# Patient Record
Sex: Female | Born: 1955 | Race: Black or African American | Hispanic: No | Marital: Married | Smoking: Never smoker
Health system: Southern US, Community
[De-identification: ages and names within clinical notes are randomized; demographics above are authoritative.]

## PROBLEM LIST (undated history)

## (undated) DIAGNOSIS — C50919 Malignant neoplasm of unspecified site of unspecified female breast: Secondary | ICD-10-CM

## (undated) DIAGNOSIS — I341 Nonrheumatic mitral (valve) prolapse: Secondary | ICD-10-CM

## (undated) DIAGNOSIS — M21619 Bunion of unspecified foot: Secondary | ICD-10-CM

## (undated) DIAGNOSIS — G5600 Carpal tunnel syndrome, unspecified upper limb: Secondary | ICD-10-CM

## (undated) DIAGNOSIS — M8580 Other specified disorders of bone density and structure, unspecified site: Secondary | ICD-10-CM

## (undated) HISTORY — DX: Nonrheumatic mitral (valve) prolapse: I34.1

## (undated) HISTORY — DX: Other specified disorders of bone density and structure, unspecified site: M85.80

## (undated) HISTORY — DX: Carpal tunnel syndrome, unspecified upper limb: G56.00

## (undated) HISTORY — DX: Bunion of unspecified foot: M21.619

## (undated) HISTORY — DX: Malignant neoplasm of unspecified site of unspecified female breast: C50.919

---

## 1991-11-24 DIAGNOSIS — C50919 Malignant neoplasm of unspecified site of unspecified female breast: Secondary | ICD-10-CM

## 1991-11-24 HISTORY — PX: MASTECTOMY: SHX3

## 1991-11-24 HISTORY — DX: Malignant neoplasm of unspecified site of unspecified female breast: C50.919

## 1998-11-04 ENCOUNTER — Other Ambulatory Visit: Admission: RE | Admit: 1998-11-04 | Discharge: 1998-11-04 | Payer: Self-pay | Admitting: Obstetrics & Gynecology

## 2002-04-12 ENCOUNTER — Other Ambulatory Visit: Admission: RE | Admit: 2002-04-12 | Discharge: 2002-04-12 | Payer: Self-pay | Admitting: Obstetrics and Gynecology

## 2002-05-08 ENCOUNTER — Ambulatory Visit (HOSPITAL_BASED_OUTPATIENT_CLINIC_OR_DEPARTMENT_OTHER): Admission: RE | Admit: 2002-05-08 | Discharge: 2002-05-08 | Payer: Self-pay | Admitting: Surgery

## 2002-05-08 ENCOUNTER — Encounter (INDEPENDENT_AMBULATORY_CARE_PROVIDER_SITE_OTHER): Payer: Self-pay | Admitting: *Deleted

## 2003-06-29 ENCOUNTER — Other Ambulatory Visit: Admission: RE | Admit: 2003-06-29 | Discharge: 2003-06-29 | Payer: Self-pay | Admitting: Obstetrics and Gynecology

## 2004-06-30 ENCOUNTER — Other Ambulatory Visit: Admission: RE | Admit: 2004-06-30 | Discharge: 2004-06-30 | Payer: Self-pay | Admitting: Obstetrics and Gynecology

## 2005-07-08 ENCOUNTER — Other Ambulatory Visit: Admission: RE | Admit: 2005-07-08 | Discharge: 2005-07-08 | Payer: Self-pay | Admitting: Obstetrics and Gynecology

## 2005-12-16 ENCOUNTER — Ambulatory Visit: Payer: Self-pay | Admitting: Gastroenterology

## 2006-07-12 ENCOUNTER — Other Ambulatory Visit: Admission: RE | Admit: 2006-07-12 | Discharge: 2006-07-12 | Payer: Self-pay | Admitting: Obstetrics and Gynecology

## 2009-12-25 ENCOUNTER — Encounter: Admission: RE | Admit: 2009-12-25 | Discharge: 2009-12-25 | Payer: Self-pay | Admitting: Family Medicine

## 2010-01-22 ENCOUNTER — Encounter: Admission: RE | Admit: 2010-01-22 | Discharge: 2010-01-22 | Payer: Self-pay | Admitting: Family Medicine

## 2010-07-02 ENCOUNTER — Encounter: Admission: RE | Admit: 2010-07-02 | Discharge: 2010-07-02 | Payer: Self-pay | Admitting: Family Medicine

## 2010-12-14 ENCOUNTER — Encounter: Payer: Self-pay | Admitting: Family Medicine

## 2011-04-10 NOTE — Op Note (Signed)
[place]  Patient:    Sandra Rojas, Sandra Rojas Visit Number: [id number] MRN: [id number]          Service Type: DSU Location: [facility] Attending Physician:  Katha Cabal Dictated by:   Thorn[place] Park Daphine Deutscher, M.D. Proc. Date: 05/08/02 Admit Date:  05/08/2002   CC:         Quita Skye. Artis Flock, M.D.  Janine Limbo, M.D.  Alfredia Ferguson, M.D.   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 Following her chemotherapy and TRAM flap, she has gotten along very well. She was referred when a little small area was felt palpable in the lateral most aspect of the TRAM. This area was of concern to her and she wanted to go ahead and have this excised and again, it is a new palpable mass in the scar in the superior lateral aspect of the TRAM scar.  SURGEON:  Thornton Park. Daphine Deutscher, M.D.  ANESTHESIA:  MAC.  DESCRIPTION OF PROCEDURE:  The patient was taken to room 8 at [facility] on May 08, 2002, and after the area had been localized and sedation, the area was prepped in Betadine and draped sterilely. I infiltrated along the scar with 1% lidocaine. I cut an ellipse of tissue removing the old scar and then going down deep to remove the palpable mass. Bleeding was controlled with the electrocautery and with one little stitch of 4-0 Vicryl. A mass was brought out and opened and I cut into it and put a stitch to mark it, but it appeared to be more like scar tissue possibly from a focus of fat necrosis. The wound was closed with 4-0 and 5-0 Vicryl with Benzoin Steri-Strips on the skin. The patient seemed to tolerate the procedure well. She will be given Vicodin to take for pain and will be followed up in the office in approximately two weeks. Dictated by:    Thornton Park Daphine Deutscher, M.D. Attending Physician:  Katha Cabal DD:  05/08/02 TD:  05/09/02 Job: [id number]

## 2011-06-23 ENCOUNTER — Other Ambulatory Visit: Payer: Self-pay | Admitting: Family Medicine

## 2011-06-23 DIAGNOSIS — R911 Solitary pulmonary nodule: Secondary | ICD-10-CM

## 2011-07-13 ENCOUNTER — Ambulatory Visit
Admission: RE | Admit: 2011-07-13 | Discharge: 2011-07-13 | Disposition: A | Discharge status: Home / Self Care | Payer: No Typology Code available for payment source | Source: Ambulatory Visit | Attending: Family Medicine | Admitting: Family Medicine

## 2011-07-13 ENCOUNTER — Other Ambulatory Visit: Payer: Self-pay | Admitting: Family Medicine

## 2011-07-13 DIAGNOSIS — R911 Solitary pulmonary nodule: Secondary | ICD-10-CM

## 2011-08-25 ENCOUNTER — Institutional Professional Consult (permissible substitution): Payer: No Typology Code available for payment source | Admitting: Pulmonary Disease

## 2011-09-29 ENCOUNTER — Encounter: Payer: Self-pay | Admitting: Pulmonary Disease

## 2011-09-30 ENCOUNTER — Encounter: Payer: Self-pay | Admitting: Pulmonary Disease

## 2011-09-30 ENCOUNTER — Ambulatory Visit (INDEPENDENT_AMBULATORY_CARE_PROVIDER_SITE_OTHER): Payer: Managed Care, Other (non HMO) | Admitting: Pulmonary Disease

## 2011-09-30 VITALS — BP 114/76 | HR 69 | Temp 98.1°F | Ht 65.5 in | Wt 132.6 lb

## 2011-09-30 DIAGNOSIS — R918 Other nonspecific abnormal finding of lung field: Secondary | ICD-10-CM | POA: Insufficient documentation

## 2011-09-30 NOTE — Patient Instructions (Signed)
No further workup recommended at this time for your ct chest. Would be happy to see you again if you develop pulmonary symptoms or a suggestion of recurrent pulmonary infection.

## 2011-09-30 NOTE — Assessment & Plan Note (Signed)
The patient has apical scarring with areas of bronchiectasis that are stable from her CT of a year ago.  She also has tiny pulmonary nodules with an area of tree in bud that are also stable from last year.  Most likely, these are inflammatory in nature, and may be related to MAC.  The patient is totally asymptomatic from a pulmonary standpoint, and therefore I would not pursue any further.  I had a long discussion with her about scarring, bronchiectasis, as well as possible MAC colonization.  I have explained that she would not require further followup unless she begins to develop new clinical symptoms.

## 2011-09-30 NOTE — Progress Notes (Signed)
  Subjective:    Patient ID: Sandra Rojas, female    DOB: Jun 13, 1956, 55 y.o.   MRN: [id number]  HPI The patient is a 55 year old female who I've been asked to see for an abnormal CT chest.  This was first noted in 2011, and this showed scarring and bronchiectasis in the apices bilaterally, scattered tiny pulmonary nodules, as well as one area of tree in bud nodularity in the left lower lobe.  She has undergone a repeat CT scan this year, with no change in the above findings.  The patient denies all pulmonary symptoms, including cough, congestion, mucus, or dyspnea.  She is eating well, and her weight has been stable.  She has never lived in [place] or [place], and has no history of TB exposure.  She has a history of a negative PPD in the past.  She has never smoked, and her health maintenance is up to date.  She does have a history of breast cancer in 1993 where she underwent mastectomy and radiation.   Review of Systems  Constitutional: Negative for fever and unexpected weight change.  HENT: Positive for nosebleeds. Negative for ear pain, congestion, sore throat, rhinorrhea, sneezing, trouble swallowing, dental problem, postnasal drip and sinus pressure.   Eyes: Negative for redness and itching.  Respiratory: Negative for cough, shortness of breath and wheezing.   Cardiovascular: Positive for palpitations. Negative for leg swelling.  Gastrointestinal: Negative for nausea and vomiting.  Genitourinary: Negative for dysuria.  Musculoskeletal: Negative for gait problem.  Skin: Negative for rash.  Neurological: Negative for headaches.  Hematological: Does not bruise/bleed easily.  Psychiatric/Behavioral: Negative for dysphoric mood. The patient is not nervous/anxious.        Objective:   Physical Exam Constitutional:  Well developed, no acute distress  HENT:  Nares patent without discharge  Oropharynx without exudate, palate and uvula are normal  Eyes:  Perrla, eomi, no scleral  icterus  Neck:  No JVD, no TMG  Cardiovascular:  Normal rate, regular rhythm, no rubs or gallops. 2/6 sem        Intact distal pulses  Pulmonary :  Normal breath sounds, no stridor or respiratory distress   No rales, rhonchi, or wheezing  Abdominal:  Soft, nondistended, bowel sounds present.  No tenderness noted.   Musculoskeletal:  No lower extremity edema noted.  Lymph Nodes:  No cervical lymphadenopathy noted  Skin:  No cyanosis noted  Neurologic:  Alert, appropriate, moves all 4 extremities without obvious deficit.         Assessment & Plan:

## 2012-01-05 ENCOUNTER — Other Ambulatory Visit: Payer: Self-pay | Admitting: Family Medicine

## 2012-01-05 DIAGNOSIS — Z800 Family history of malignant neoplasm of digestive organs: Secondary | ICD-10-CM

## 2012-02-26 ENCOUNTER — Other Ambulatory Visit: Payer: Managed Care, Other (non HMO)

## 2012-03-06 IMAGING — CT CT CHEST W/O CM
3 of 4 series · 17 of 30 positions shown, 19 images · non-contrast
Comparison: CT chest of 07/02/2010 and 12/25/2009

CLINICAL DATA: Follow up of lung nodules, history of right breast
carcinoma in 3004 with mastectomy, chemotherapy, and radiation
therapy

CT CHEST WITHOUT CONTRAST
TECHNIQUE: Multidetector CT imaging of the chest was performed
following the standard protocol without IV contrast.

[Series 3: routine chest · axial · 0.70mm/px · z∈[-283,-53]mm · 5 of 70 slices shown, 7 images]
[im 12/70  mediastinal]
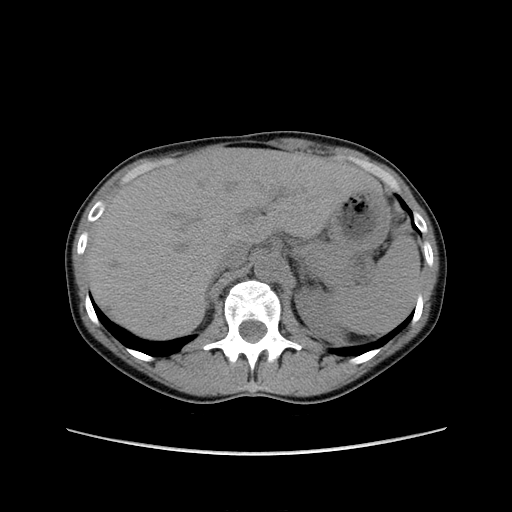
[im 12/70  lung]
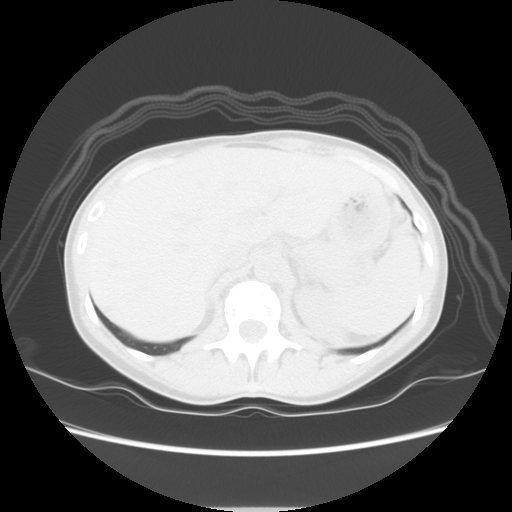
[im 24/70  lung]
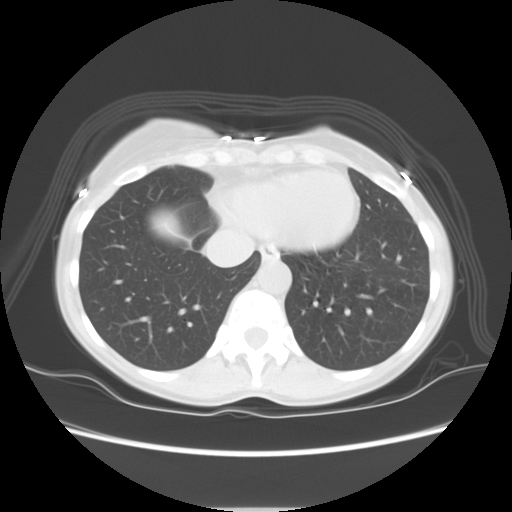
[im 35/70  lung]
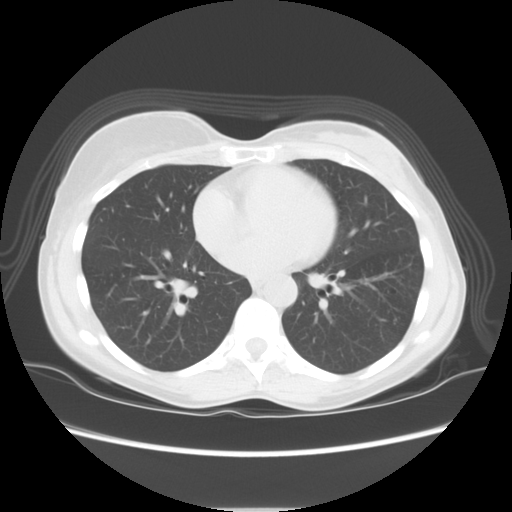
[im 47/70  lung]
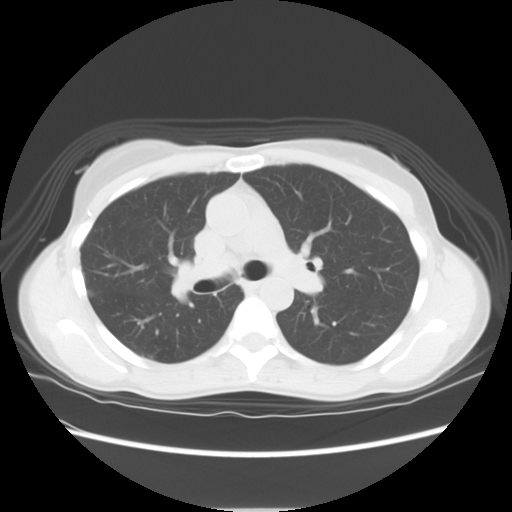
[im 58/70  mediastinal]
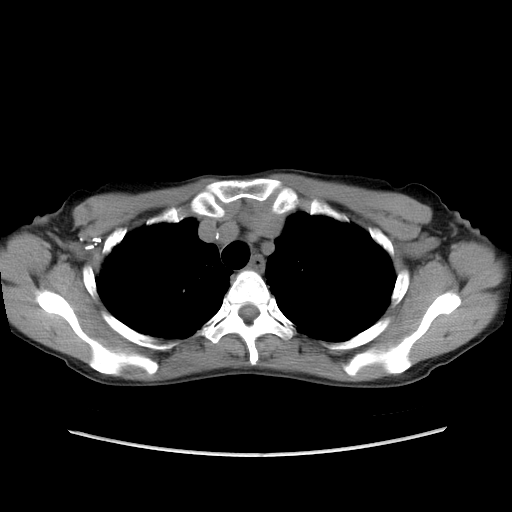
[im 58/70  lung]
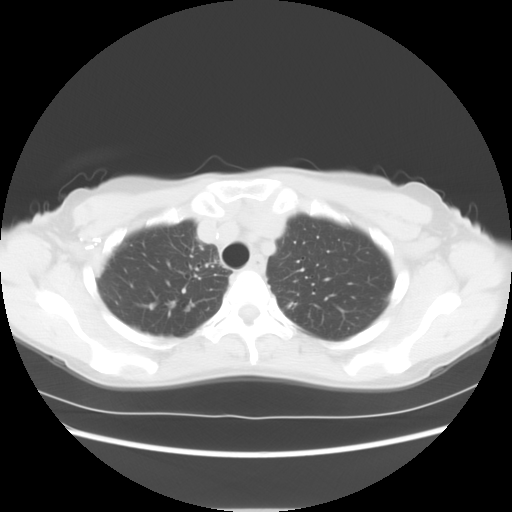

[Series 4: lung windows · axial · 0.70mm/px · z∈[-238,-58]mm · 4 of 62 slices shown]
[im 13/62  lung]
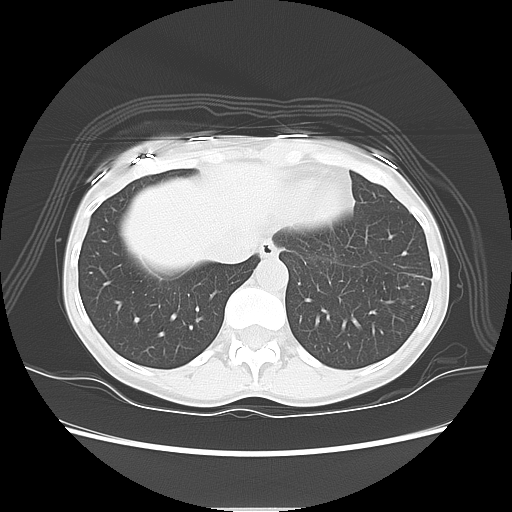
[im 25/62  lung]
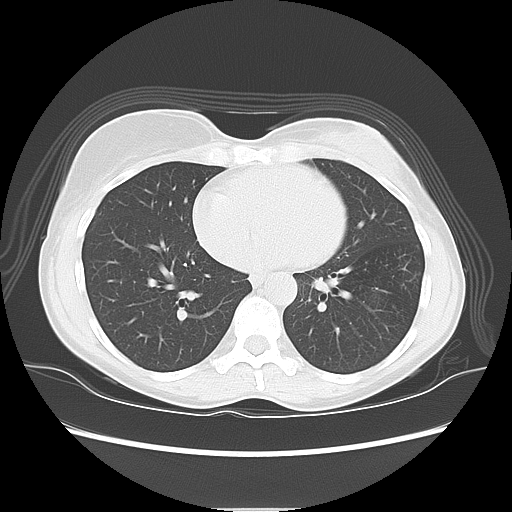
[im 37/62  lung]
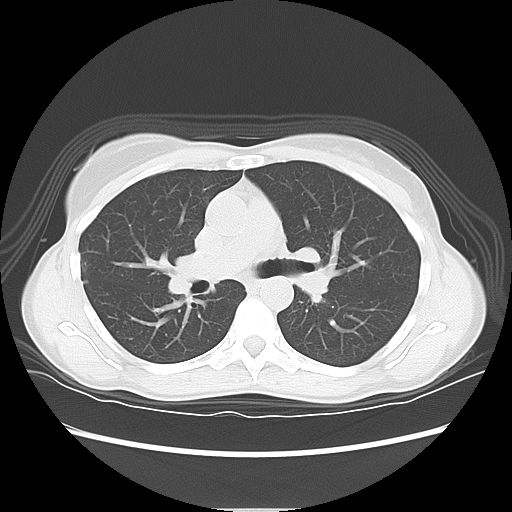
[im 49/62  lung]
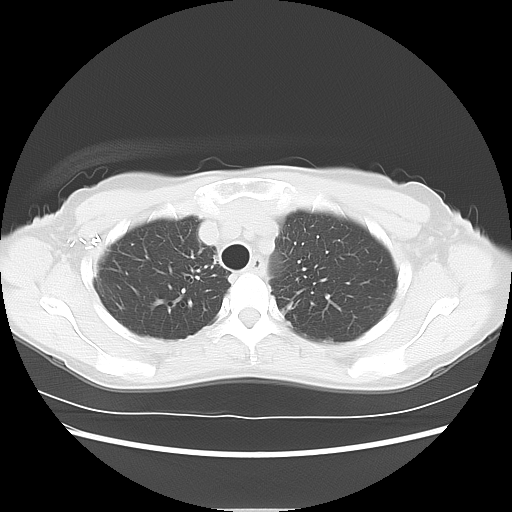

[Series 602: sagittal body · sagittal · 0.70mm/px · 8 of 145 slices shown]
[im 11/145  mediastinal]
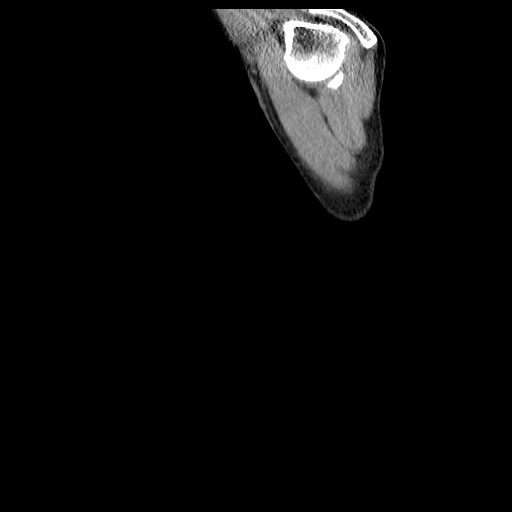
[im 31/145  mediastinal]
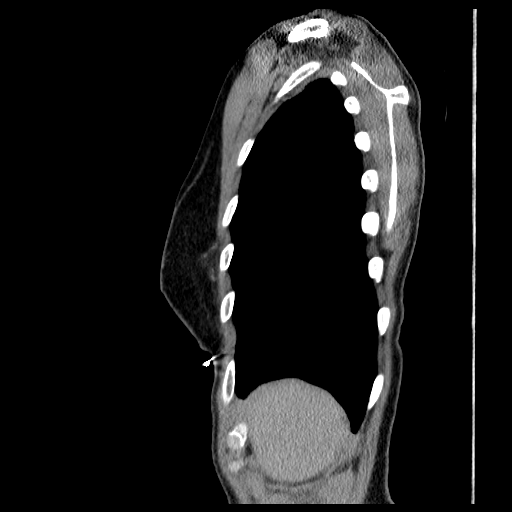
[im 52/145  mediastinal]
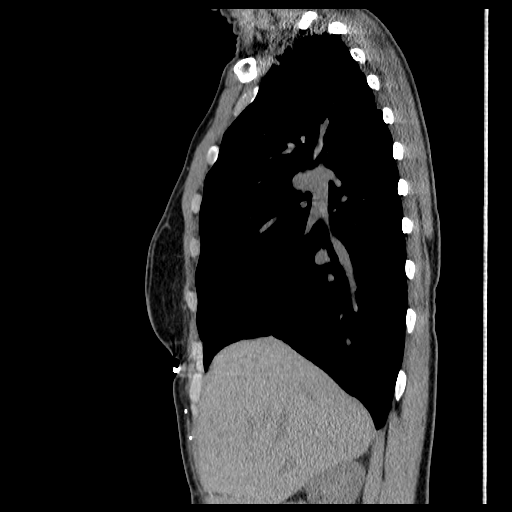
[im 62/145  mediastinal]
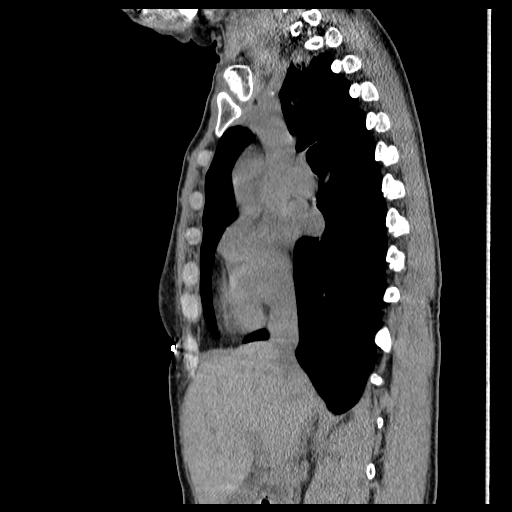
[im 83/145  mediastinal]
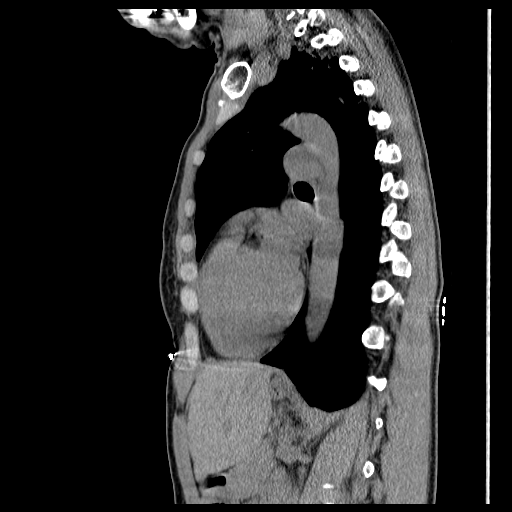
[im 93/145  mediastinal]
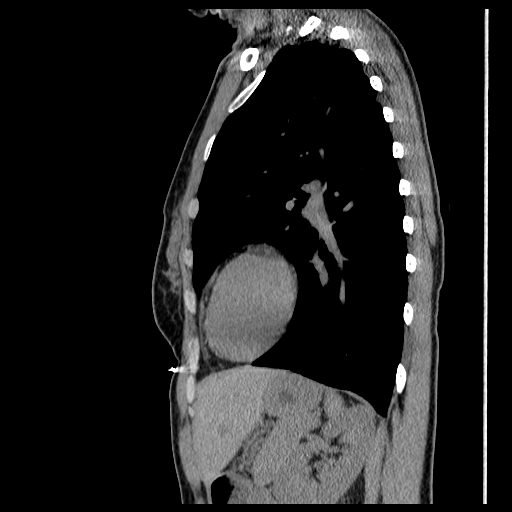
[im 114/145  mediastinal]
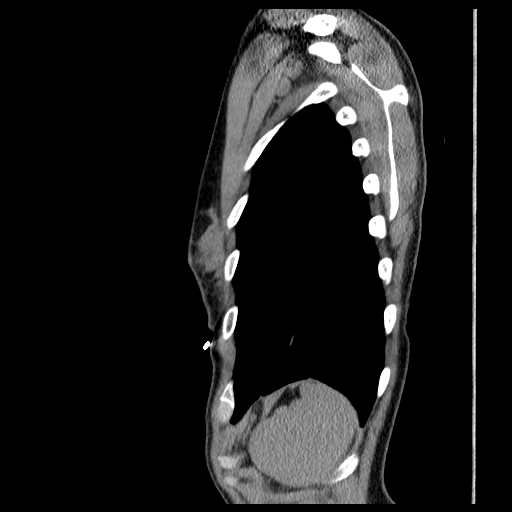
[im 134/145  mediastinal]
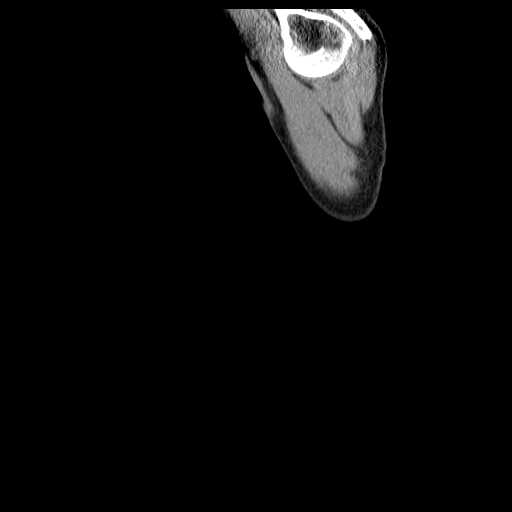

[17 of 30 positions shown; findings below may reference images not displayed]

FINDINGS: Pleuroparenchymal scarring in the medial lung apices
appears stable.  The tiny lung of nodular opacity is noted
previously have all remained completely stable and consistent with
benign process.  There is very subtle tree in bud opacity
peripherally in the left lower lobe anteriorly which is not seen
with certainty previously.  This could indicate bronchiolitis which
may be chronic, and is nonspecific.  With a few scattered lung
nodules Mycobacterium avium and changes of organizing pneumonia
would be considerations. No pleural effusion is seen.  No bony
abnormality is seen.

On soft tissue window images, no mediastinal or hilar adenopathy is
seen.  No coronary artery calcifications are evident.  The portion
of the upper abdomen that is visualized is unremarkable with
probable small cyst within the upper pole of the right kidney.
IMPRESSION: 1.  Stable tiny lung nodules most consistent with a benign process.
2.  Mild changes of "tree in bud" within the periphery of the left
lower lobe possibly chronic.  Consider bronchiolitis associated
with Mycobacterium avium or possibly organizing pneumonia.

## 2012-09-06 ENCOUNTER — Ambulatory Visit: Payer: Self-pay | Admitting: Obstetrics and Gynecology

## 2012-09-20 ENCOUNTER — Encounter: Payer: Self-pay | Admitting: Obstetrics and Gynecology

## 2012-09-20 ENCOUNTER — Ambulatory Visit (INDEPENDENT_AMBULATORY_CARE_PROVIDER_SITE_OTHER): Payer: Managed Care, Other (non HMO) | Admitting: Obstetrics and Gynecology

## 2012-09-20 VITALS — BP 112/62 | Ht 64.5 in | Wt 131.0 lb

## 2012-09-20 DIAGNOSIS — Z01419 Encounter for gynecological examination (general) (routine) without abnormal findings: Secondary | ICD-10-CM

## 2012-09-20 NOTE — Progress Notes (Signed)
Subjective:    Sandra Rojas is a 56 y.o. female G1P1 who presents for annual exam. The patient complaints of bowel movements every 2-3 days. The patient had a right mastectomy 20 years ago because of breast cancer.  Her sister developed breast cancer last year.  BRCA testing was negative.  The patient elected to have a left mastectomy in September 2013.  The following portions of the patient's history were reviewed and updated as appropriate: allergies, current medications, past family history, past medical history, past social history, past surgical history and problem list.  Review of Systems Pertinent items are noted in HPI. Gastrointestinal:No change in bowel habits, no abdominal pain, no rectal bleeding Genitourinary:negative for dysuria, frequency, hematuria, nocturia and urinary incontinence    Objective:     BP 112/62  Ht 5' 4.5" (1.638 m)  Wt 131 lb (59.421 kg)  BMI 22.14 kg/m2  Weight:  Wt Readings from Last 1 Encounters:  09/20/12 131 lb (59.421 kg)     BMI: Body mass index is 22.14 kg/(m^2). General Appearance: Alert, appropriate appearance for age. No acute distress HEENT: Grossly normal Neck / Thyroid: Supple, no masses, nodes or enlargement Lungs: clear to auscultation bilaterally Back: No CVA tenderness Breast Exam: healed scars bilaterally.  JP drain is currently in the left chest. No masses or nodes.No dimpling, nipple retraction or discharge. Cardiovascular: Regular rate and rhythm. S1, S2, no murmur Gastrointestinal: Soft, non-tender, no masses or organomegaly  ++++++++++++++++++++++++++++++++++++++++++++++++++++++++  Pelvic Exam: External genitalia: normal general appearance Vaginal: normal without tenderness, induration or masses and relaxation Cervix: normal appearance Adnexa: normal bimanual exam Uterus: normal size shape and consistency Rectovaginal: normal rectal, no masses  ++++++++++++++++++++++++++++++++++++++++++++++++++++++++  Lymphatic  Exam: Non-palpable nodes in neck, clavicular, axillary, or inguinal regions  Psychiatric: Alert and oriented, appropriate affect.      Assessment:    Normal gyn exam   Overweight or obese: No  Pelvic relaxation: Yes  Menopausal symptoms: No. Severe: No.  History of breast cancer 20 years ago.  No evidence of disease.   Plan:    menopause discussed   Follow-up:  for annual exam  The updated Pap smear screening guidelines were discussed with the patient. The patient requested that I obtain a Pap smear: No. Pap smear in 2014.  Kegel exercises discussed: Yes.  Needs bone density scan.  Proper diet and regular exercise were reviewed.  Annual mammograms recommended starting at age 54. Proper breast care was discussed.  Screening colonoscopy is recommended beginning at age 42.  Regular health maintenance was reviewed.  Sleep hygiene was discussed.  Adequate calcium and vitamin D intake was emphasized.  Leonard Schwartz M.D.   Regular Periods: no Mammogram: yes  Monthly Breast Ex.: yes Exercise: no  Tetanus < 10 years: yes Seatbelts: yes  NI. Bladder Functn.: yes Abuse at home: no  Daily BM's: yes Stressful Work: no  Healthy Diet: yes Sigmoid-Colonoscopy: 2013  Calcium: no Medical problems this year: Pt has mastectomy 08/02/2012   LAST PAP: 08/20/2010  Contraception: Post-Menopause / Vasectomy  Mammogram:  03/2011  PCP:  Dr. Zachery Dauer  PMH: None  FMH: None  Last Bone Scan:  05/16/2010

## 2012-10-14 ENCOUNTER — Other Ambulatory Visit: Payer: Managed Care, Other (non HMO)

## 2012-10-19 ENCOUNTER — Other Ambulatory Visit: Payer: Self-pay

## 2012-10-19 DIAGNOSIS — Z13820 Encounter for screening for osteoporosis: Secondary | ICD-10-CM

## 2012-10-24 ENCOUNTER — Other Ambulatory Visit: Payer: Managed Care, Other (non HMO)

## 2012-10-24 ENCOUNTER — Ambulatory Visit (INDEPENDENT_AMBULATORY_CARE_PROVIDER_SITE_OTHER): Payer: Managed Care, Other (non HMO)

## 2012-10-24 DIAGNOSIS — Z13820 Encounter for screening for osteoporosis: Secondary | ICD-10-CM

## 2012-10-24 DIAGNOSIS — M949 Disorder of cartilage, unspecified: Secondary | ICD-10-CM

## 2012-10-24 DIAGNOSIS — M899 Disorder of bone, unspecified: Secondary | ICD-10-CM

## 2012-10-25 ENCOUNTER — Telehealth: Payer: Self-pay | Admitting: Obstetrics and Gynecology

## 2012-10-25 NOTE — Telephone Encounter (Signed)
Patient called.  DEXA scan discussed.  Results are stable.  Dr. Stefano Gaul

## 2014-09-24 ENCOUNTER — Encounter: Payer: Self-pay | Admitting: Obstetrics and Gynecology

## 2020-01-13 ENCOUNTER — Ambulatory Visit: Payer: Medicare Other

## 2020-09-19 ENCOUNTER — Ambulatory Visit: Payer: Medicare Other | Attending: Internal Medicine

## 2020-09-19 DIAGNOSIS — Z23 Encounter for immunization: Secondary | ICD-10-CM

## 2020-09-19 NOTE — Progress Notes (Signed)
   Covid-19 Vaccination Clinic  Name:  Sandra Rojas    MRN: [id number] DOB: 03/01/1956  09/19/2020  [name] was observed post Covid-19 immunization for 15 minutes without incident. She was provided with Vaccine Information Sheet and instruction to access the V-Safe system.   [name] was instructed to call 911 with any severe reactions post vaccine: Marland Kitchen Difficulty breathing  . Swelling of face and throat  . A fast heartbeat  . A bad rash all over body  . Dizziness and weakness

## 2021-06-13 ENCOUNTER — Ambulatory Visit: Payer: Medicare Other | Attending: Internal Medicine

## 2021-06-13 ENCOUNTER — Ambulatory Visit: Payer: Medicare Other

## 2021-06-13 DIAGNOSIS — Z23 Encounter for immunization: Secondary | ICD-10-CM

## 2021-06-13 NOTE — Progress Notes (Signed)
   Covid-19 Vaccination Clinic  Name:  Sandra Rojas    MRN: [id number] DOB: Jun 18, 1956  06/13/2021  Sandra Rojas was observed post Covid-19 immunization for 15 minutes without incident. She was provided with Vaccine Information Sheet and instruction to access the V-Safe system.   Sandra Rojas was instructed to call 911 with any severe reactions post vaccine: Difficulty breathing  Swelling of face and throat  A fast heartbeat  A bad rash all over body  Dizziness and weakness   Immunizations Administered     Name Date Dose VIS Date Route   Moderna Covid-19 Booster Vaccine 06/13/2021  2:21 PM 0.25 mL 09/11/2020 Intramuscular   Manufacturer: Moderna   Lot: [id number]   [place][id number]

## 2021-06-16 ENCOUNTER — Other Ambulatory Visit (HOSPITAL_BASED_OUTPATIENT_CLINIC_OR_DEPARTMENT_OTHER): Payer: Self-pay

## 2021-06-16 MED ORDER — COVID-19 MRNA VACC (MODERNA) 100 MCG/0.5ML IM SUSP
INTRAMUSCULAR | 0 refills | Status: AC
  Filled 2021-06-16: qty 0.25, 1d supply, fill #0

## 2021-10-28 ENCOUNTER — Ambulatory Visit: Payer: Self-pay | Attending: Internal Medicine

## 2021-10-28 ENCOUNTER — Other Ambulatory Visit (HOSPITAL_BASED_OUTPATIENT_CLINIC_OR_DEPARTMENT_OTHER): Payer: Self-pay

## 2021-10-28 DIAGNOSIS — Z23 Encounter for immunization: Secondary | ICD-10-CM

## 2021-10-28 MED ORDER — MODERNA COVID-19 BIVAL BOOSTER 50 MCG/0.5ML IM SUSP
INTRAMUSCULAR | 0 refills | Status: AC
  Filled 2021-10-28: qty 0.5, 1d supply, fill #0

## 2021-10-28 NOTE — Progress Notes (Signed)
   Covid-19 Vaccination Clinic  Name:  Phoenicia Pirie    MRN: [id number] DOB: 07/20/1959  10/28/2021  Ms. Mermelstein was observed post Covid-19 immunization for 15 minutes without incident. She was provided with Vaccine Information Sheet and instruction to access the V-Safe system.   Ms. Mcgeachy was instructed to call 911 with any severe reactions post vaccine: Difficulty breathing  Swelling of face and throat  A fast heartbeat  A bad rash all over body  Dizziness and weakness   Immunizations Administered     Name Date Dose VIS Date Route   Moderna Covid-19 vaccine Bivalent Booster 10/28/2021  1:59 PM 0.5 mL 07/05/2021 Intramuscular   Manufacturer: Levan Hurst   Lot: [id number]   [place]: [id number]

## 2022-03-18 DIAGNOSIS — M25512 Pain in left shoulder: Secondary | ICD-10-CM | POA: Diagnosis not present

## 2022-03-18 DIAGNOSIS — M0579 Rheumatoid arthritis with rheumatoid factor of multiple sites without organ or systems involvement: Secondary | ICD-10-CM | POA: Diagnosis not present

## 2022-06-05 ENCOUNTER — Ambulatory Visit: Payer: Medicare Other | Admitting: Internal Medicine

## 2022-06-25 DIAGNOSIS — M0579 Rheumatoid arthritis with rheumatoid factor of multiple sites without organ or systems involvement: Secondary | ICD-10-CM | POA: Diagnosis not present

## 2022-08-18 ENCOUNTER — Ambulatory Visit: Payer: Medicare Other | Admitting: Internal Medicine

## 2022-09-03 ENCOUNTER — Other Ambulatory Visit (HOSPITAL_BASED_OUTPATIENT_CLINIC_OR_DEPARTMENT_OTHER): Payer: Self-pay

## 2022-09-03 MED ORDER — COMIRNATY 30 MCG/0.3ML IM SUSP
INTRAMUSCULAR | 0 refills | Status: AC
  Filled 2022-09-03: qty 0.3, 1d supply, fill #0

## 2022-09-17 DIAGNOSIS — M25512 Pain in left shoulder: Secondary | ICD-10-CM | POA: Diagnosis not present

## 2022-09-17 DIAGNOSIS — M0579 Rheumatoid arthritis with rheumatoid factor of multiple sites without organ or systems involvement: Secondary | ICD-10-CM | POA: Diagnosis not present

## 2022-10-09 DIAGNOSIS — Z0000 Encounter for general adult medical examination without abnormal findings: Secondary | ICD-10-CM | POA: Diagnosis not present

## 2022-11-02 DIAGNOSIS — H2513 Age-related nuclear cataract, bilateral: Secondary | ICD-10-CM | POA: Diagnosis not present

## 2022-11-02 DIAGNOSIS — H40023 Open angle with borderline findings, high risk, bilateral: Secondary | ICD-10-CM | POA: Diagnosis not present

## 2022-11-20 DIAGNOSIS — R519 Headache, unspecified: Secondary | ICD-10-CM | POA: Diagnosis not present

## 2022-11-20 DIAGNOSIS — R051 Acute cough: Secondary | ICD-10-CM | POA: Diagnosis not present

## 2022-11-20 DIAGNOSIS — R509 Fever, unspecified: Secondary | ICD-10-CM | POA: Diagnosis not present

## 2022-11-20 DIAGNOSIS — J029 Acute pharyngitis, unspecified: Secondary | ICD-10-CM | POA: Diagnosis not present

## 2022-12-03 ENCOUNTER — Other Ambulatory Visit: Payer: Self-pay | Admitting: Obstetrics & Gynecology

## 2022-12-03 DIAGNOSIS — M8580 Other specified disorders of bone density and structure, unspecified site: Secondary | ICD-10-CM

## 2022-12-08 DIAGNOSIS — C50919 Malignant neoplasm of unspecified site of unspecified female breast: Secondary | ICD-10-CM | POA: Diagnosis not present

## 2022-12-08 DIAGNOSIS — Z6821 Body mass index (BMI) 21.0-21.9, adult: Secondary | ICD-10-CM | POA: Diagnosis not present

## 2022-12-08 DIAGNOSIS — M8580 Other specified disorders of bone density and structure, unspecified site: Secondary | ICD-10-CM | POA: Diagnosis not present

## 2022-12-17 DIAGNOSIS — M0579 Rheumatoid arthritis with rheumatoid factor of multiple sites without organ or systems involvement: Secondary | ICD-10-CM | POA: Diagnosis not present

## 2022-12-18 ENCOUNTER — Other Ambulatory Visit: Payer: Medicare Other

## 2023-01-19 ENCOUNTER — Ambulatory Visit
Admission: RE | Admit: 2023-01-19 | Discharge: 2023-01-19 | Disposition: A | Discharge status: Home / Self Care | Payer: Medicare Other | Source: Ambulatory Visit | Attending: Obstetrics & Gynecology | Admitting: Obstetrics & Gynecology

## 2023-01-19 DIAGNOSIS — M8580 Other specified disorders of bone density and structure, unspecified site: Secondary | ICD-10-CM

## 2023-01-19 DIAGNOSIS — M810 Age-related osteoporosis without current pathological fracture: Secondary | ICD-10-CM | POA: Diagnosis not present

## 2023-01-19 DIAGNOSIS — Z780 Asymptomatic menopausal state: Secondary | ICD-10-CM | POA: Diagnosis not present

## 2023-01-19 DIAGNOSIS — M85852 Other specified disorders of bone density and structure, left thigh: Secondary | ICD-10-CM | POA: Diagnosis not present

## 2023-01-26 DIAGNOSIS — M810 Age-related osteoporosis without current pathological fracture: Secondary | ICD-10-CM | POA: Diagnosis not present

## 2023-02-08 DIAGNOSIS — M810 Age-related osteoporosis without current pathological fracture: Secondary | ICD-10-CM | POA: Diagnosis not present

## 2023-03-17 DIAGNOSIS — M25512 Pain in left shoulder: Secondary | ICD-10-CM | POA: Diagnosis not present

## 2023-03-17 DIAGNOSIS — M79645 Pain in left finger(s): Secondary | ICD-10-CM | POA: Diagnosis not present

## 2023-03-17 DIAGNOSIS — M0579 Rheumatoid arthritis with rheumatoid factor of multiple sites without organ or systems involvement: Secondary | ICD-10-CM | POA: Diagnosis not present

## 2023-04-30 DIAGNOSIS — M79641 Pain in right hand: Secondary | ICD-10-CM | POA: Diagnosis not present

## 2023-04-30 DIAGNOSIS — M79642 Pain in left hand: Secondary | ICD-10-CM | POA: Diagnosis not present

## 2023-05-10 DIAGNOSIS — H40023 Open angle with borderline findings, high risk, bilateral: Secondary | ICD-10-CM | POA: Diagnosis not present

## 2023-06-14 DIAGNOSIS — M21611 Bunion of right foot: Secondary | ICD-10-CM | POA: Diagnosis not present

## 2023-06-14 DIAGNOSIS — M79671 Pain in right foot: Secondary | ICD-10-CM | POA: Diagnosis not present

## 2023-06-14 DIAGNOSIS — M0579 Rheumatoid arthritis with rheumatoid factor of multiple sites without organ or systems involvement: Secondary | ICD-10-CM | POA: Diagnosis not present

## 2023-06-14 DIAGNOSIS — M19071 Primary osteoarthritis, right ankle and foot: Secondary | ICD-10-CM | POA: Diagnosis not present

## 2023-09-13 DIAGNOSIS — Z6822 Body mass index (BMI) 22.0-22.9, adult: Secondary | ICD-10-CM | POA: Diagnosis not present

## 2023-09-13 DIAGNOSIS — M0579 Rheumatoid arthritis with rheumatoid factor of multiple sites without organ or systems involvement: Secondary | ICD-10-CM | POA: Diagnosis not present

## 2023-09-13 DIAGNOSIS — M79645 Pain in left finger(s): Secondary | ICD-10-CM | POA: Diagnosis not present

## 2023-09-13 DIAGNOSIS — M25512 Pain in left shoulder: Secondary | ICD-10-CM | POA: Diagnosis not present

## 2023-09-13 DIAGNOSIS — Z79899 Other long term (current) drug therapy: Secondary | ICD-10-CM | POA: Diagnosis not present

## 2023-10-13 DIAGNOSIS — Z0000 Encounter for general adult medical examination without abnormal findings: Secondary | ICD-10-CM | POA: Diagnosis not present

## 2023-11-09 DIAGNOSIS — M21611 Bunion of right foot: Secondary | ICD-10-CM | POA: Diagnosis not present

## 2023-11-09 DIAGNOSIS — M19071 Primary osteoarthritis, right ankle and foot: Secondary | ICD-10-CM | POA: Diagnosis not present

## 2023-11-09 DIAGNOSIS — M79671 Pain in right foot: Secondary | ICD-10-CM | POA: Diagnosis not present

## 2023-12-20 DIAGNOSIS — M0579 Rheumatoid arthritis with rheumatoid factor of multiple sites without organ or systems involvement: Secondary | ICD-10-CM | POA: Diagnosis not present

## 2024-01-13 DIAGNOSIS — M79645 Pain in left finger(s): Secondary | ICD-10-CM | POA: Diagnosis not present

## 2024-01-13 DIAGNOSIS — Z6821 Body mass index (BMI) 21.0-21.9, adult: Secondary | ICD-10-CM | POA: Diagnosis not present

## 2024-01-13 DIAGNOSIS — M25512 Pain in left shoulder: Secondary | ICD-10-CM | POA: Diagnosis not present

## 2024-01-13 DIAGNOSIS — Z79899 Other long term (current) drug therapy: Secondary | ICD-10-CM | POA: Diagnosis not present

## 2024-01-13 DIAGNOSIS — M0579 Rheumatoid arthritis with rheumatoid factor of multiple sites without organ or systems involvement: Secondary | ICD-10-CM | POA: Diagnosis not present

## 2024-02-02 DIAGNOSIS — M19071 Primary osteoarthritis, right ankle and foot: Secondary | ICD-10-CM | POA: Diagnosis not present

## 2024-02-02 DIAGNOSIS — M21611 Bunion of right foot: Secondary | ICD-10-CM | POA: Diagnosis not present

## 2024-02-11 DIAGNOSIS — Z9010 Acquired absence of unspecified breast and nipple: Secondary | ICD-10-CM | POA: Diagnosis not present

## 2024-02-11 DIAGNOSIS — Z4789 Encounter for other orthopedic aftercare: Secondary | ICD-10-CM | POA: Diagnosis not present

## 2024-02-11 DIAGNOSIS — Z853 Personal history of malignant neoplasm of breast: Secondary | ICD-10-CM | POA: Diagnosis not present

## 2024-02-11 DIAGNOSIS — M21611 Bunion of right foot: Secondary | ICD-10-CM | POA: Diagnosis not present

## 2024-02-11 DIAGNOSIS — M069 Rheumatoid arthritis, unspecified: Secondary | ICD-10-CM | POA: Diagnosis not present

## 2024-02-11 DIAGNOSIS — Z79899 Other long term (current) drug therapy: Secondary | ICD-10-CM | POA: Diagnosis not present

## 2024-02-11 DIAGNOSIS — M2011 Hallux valgus (acquired), right foot: Secondary | ICD-10-CM | POA: Diagnosis not present

## 2024-02-11 DIAGNOSIS — M19071 Primary osteoarthritis, right ankle and foot: Secondary | ICD-10-CM | POA: Diagnosis not present

## 2024-02-23 DIAGNOSIS — Z4789 Encounter for other orthopedic aftercare: Secondary | ICD-10-CM | POA: Diagnosis not present

## 2024-03-08 DIAGNOSIS — Z4789 Encounter for other orthopedic aftercare: Secondary | ICD-10-CM | POA: Diagnosis not present

## 2024-03-22 DIAGNOSIS — Z4789 Encounter for other orthopedic aftercare: Secondary | ICD-10-CM | POA: Diagnosis not present

## 2024-04-27 DIAGNOSIS — M79675 Pain in left toe(s): Secondary | ICD-10-CM | POA: Diagnosis not present

## 2024-04-27 DIAGNOSIS — M25512 Pain in left shoulder: Secondary | ICD-10-CM | POA: Diagnosis not present

## 2024-04-27 DIAGNOSIS — Z6821 Body mass index (BMI) 21.0-21.9, adult: Secondary | ICD-10-CM | POA: Diagnosis not present

## 2024-04-27 DIAGNOSIS — Z79899 Other long term (current) drug therapy: Secondary | ICD-10-CM | POA: Diagnosis not present

## 2024-04-27 DIAGNOSIS — M79645 Pain in left finger(s): Secondary | ICD-10-CM | POA: Diagnosis not present

## 2024-04-27 DIAGNOSIS — M0579 Rheumatoid arthritis with rheumatoid factor of multiple sites without organ or systems involvement: Secondary | ICD-10-CM | POA: Diagnosis not present

## 2024-05-02 DIAGNOSIS — Z4789 Encounter for other orthopedic aftercare: Secondary | ICD-10-CM | POA: Diagnosis not present

## 2024-06-28 DIAGNOSIS — Z4789 Encounter for other orthopedic aftercare: Secondary | ICD-10-CM | POA: Diagnosis not present

## 2024-06-28 DIAGNOSIS — M7750 Other enthesopathy of unspecified foot: Secondary | ICD-10-CM | POA: Diagnosis not present

## 2024-08-02 DIAGNOSIS — G4700 Insomnia, unspecified: Secondary | ICD-10-CM | POA: Diagnosis not present

## 2024-08-16 ENCOUNTER — Other Ambulatory Visit (HOSPITAL_BASED_OUTPATIENT_CLINIC_OR_DEPARTMENT_OTHER): Payer: Self-pay

## 2024-08-16 MED ORDER — FLUZONE HIGH-DOSE 0.5 ML IM SUSY
0.5000 mL | PREFILLED_SYRINGE | Freq: Once | INTRAMUSCULAR | 0 refills | Status: AC
  Filled 2024-08-16: qty 0.5, 1d supply, fill #0

## 2024-08-16 MED ORDER — COMIRNATY 30 MCG/0.3ML IM SUSY
0.3000 mL | PREFILLED_SYRINGE | Freq: Once | INTRAMUSCULAR | 0 refills | Status: AC
  Filled 2024-08-16: qty 0.3, 1d supply, fill #0

## 2024-12-15 ENCOUNTER — Other Ambulatory Visit (HOSPITAL_BASED_OUTPATIENT_CLINIC_OR_DEPARTMENT_OTHER): Payer: Self-pay | Admitting: Family Medicine

## 2024-12-15 DIAGNOSIS — M8580 Other specified disorders of bone density and structure, unspecified site: Secondary | ICD-10-CM

## 2025-01-22 ENCOUNTER — Other Ambulatory Visit (HOSPITAL_BASED_OUTPATIENT_CLINIC_OR_DEPARTMENT_OTHER)
